# Patient Record
Sex: Male | Born: 1965 | Race: White | Hispanic: No | Marital: Single | Smoking: Current every day smoker
Health system: Southern US, Community
[De-identification: ages and names within clinical notes are randomized; demographics above are authoritative.]

---

## 2006-04-07 ENCOUNTER — Emergency Department (HOSPITAL_COMMUNITY): Admission: EM | Admit: 2006-04-07 | Discharge: 2006-04-08 | Payer: Self-pay | Admitting: Emergency Medicine

## 2007-05-08 ENCOUNTER — Ambulatory Visit: Payer: Self-pay | Admitting: Neurological Surgery

## 2011-11-01 ENCOUNTER — Emergency Department: Payer: Self-pay | Admitting: Internal Medicine

## 2011-11-01 LAB — BASIC METABOLIC PANEL
BUN: 13 mg/dL (ref 7–18)
Calcium, Total: 8.5 mg/dL (ref 8.5–10.1)
Co2: 25 mmol/L (ref 21–32)
EGFR (African American): >60
EGFR (Non-African Amer.): >60
Osmolality: 271 (ref 275–301)

## 2011-11-01 LAB — CBC
HCT: 44.1 % (ref 40.0–52.0)
HGB: 15 g/dL (ref 13.0–18.0)
MCHC: 33.9 g/dL (ref 32.0–36.0)
RDW: 13.3 % (ref 11.5–14.5)
WBC: 4.9 10*3/uL (ref 3.8–10.6)

## 2011-11-01 LAB — TROPONIN I: Troponin-I: <0.02 ng/mL

## 2015-02-04 ENCOUNTER — Encounter: Payer: Self-pay | Admitting: *Deleted

## 2015-02-04 ENCOUNTER — Emergency Department
Admission: EM | Admit: 2015-02-04 | Discharge: 2015-02-04 | Discharge status: Against Medical Advice | Payer: Self-pay | Attending: Emergency Medicine | Admitting: Emergency Medicine

## 2015-02-04 DIAGNOSIS — R1110 Vomiting, unspecified: Secondary | ICD-10-CM | POA: Insufficient documentation

## 2015-02-04 DIAGNOSIS — R197 Diarrhea, unspecified: Secondary | ICD-10-CM | POA: Insufficient documentation

## 2015-02-04 DIAGNOSIS — R52 Pain, unspecified: Secondary | ICD-10-CM | POA: Insufficient documentation

## 2015-02-04 DIAGNOSIS — Z720 Tobacco use: Secondary | ICD-10-CM | POA: Insufficient documentation

## 2015-02-04 DIAGNOSIS — R509 Fever, unspecified: Secondary | ICD-10-CM | POA: Insufficient documentation

## 2015-02-04 LAB — URINALYSIS COMPLETE WITH MICROSCOPIC (ARMC ONLY)
BACTERIA UA: NONE SEEN
BILIRUBIN URINE: NEGATIVE
Glucose, UA: NEGATIVE mg/dL
HGB URINE DIPSTICK: NEGATIVE
Ketones, ur: NEGATIVE mg/dL
Leukocytes, UA: NEGATIVE
Nitrite: NEGATIVE
PH: 7 (ref 5.0–8.0)
PROTEIN: NEGATIVE mg/dL
SPECIFIC GRAVITY, URINE: 1.02 (ref 1.005–1.030)

## 2015-02-04 LAB — COMPREHENSIVE METABOLIC PANEL
ALBUMIN: 4.3 g/dL (ref 3.5–5.0)
ALT: 14 U/L — ABNORMAL LOW (ref 17–63)
ANION GAP: 10 (ref 5–15)
AST: 25 U/L (ref 15–41)
Alkaline Phosphatase: 79 U/L (ref 38–126)
BILIRUBIN TOTAL: 0.6 mg/dL (ref 0.3–1.2)
BUN: 12 mg/dL (ref 6–20)
CALCIUM: 8.7 mg/dL — AB (ref 8.9–10.3)
CHLORIDE: 98 mmol/L — AB (ref 101–111)
CO2: 23 mmol/L (ref 22–32)
CREATININE: 0.94 mg/dL (ref 0.61–1.24)
GLUCOSE: 95 mg/dL (ref 65–99)
Potassium: 3.9 mmol/L (ref 3.5–5.1)
SODIUM: 131 mmol/L — AB (ref 135–145)
TOTAL PROTEIN: 7.3 g/dL (ref 6.5–8.1)

## 2015-02-04 LAB — CBC
HCT: 42.5 % (ref 40.0–52.0)
Hemoglobin: 14.7 g/dL (ref 13.0–18.0)
MCH: 29.6 pg (ref 26.0–34.0)
MCHC: 34.7 g/dL (ref 32.0–36.0)
MCV: 85.3 fL (ref 80.0–100.0)
Platelets: 191 10*3/uL (ref 150–440)
RBC: 4.98 MIL/uL (ref 4.40–5.90)
RDW: 13.2 % (ref 11.5–14.5)
WBC: 6.9 10*3/uL (ref 3.8–10.6)

## 2015-02-04 NOTE — ED Notes (Signed)
Pt states he has had muscle aches for several days. Today, low grade fevers, body aches, vomiting and diarrhea. Took Tylenol at 1400 today.

## 2015-08-28 ENCOUNTER — Encounter: Payer: Self-pay | Admitting: *Deleted

## 2015-08-28 ENCOUNTER — Emergency Department
Admission: EM | Admit: 2015-08-28 | Discharge: 2015-08-28 | Disposition: A | Discharge status: Home / Self Care | Payer: Self-pay | Attending: Emergency Medicine | Admitting: Emergency Medicine

## 2015-08-28 DIAGNOSIS — F17210 Nicotine dependence, cigarettes, uncomplicated: Secondary | ICD-10-CM | POA: Insufficient documentation

## 2015-08-28 DIAGNOSIS — K529 Noninfective gastroenteritis and colitis, unspecified: Secondary | ICD-10-CM

## 2015-08-28 LAB — GASTROINTESTINAL PANEL BY PCR, STOOL (REPLACES STOOL CULTURE)
ADENOVIRUS F40/41: NOT DETECTED
Astrovirus: NOT DETECTED
CAMPYLOBACTER SPECIES: NOT DETECTED
Cryptosporidium: NOT DETECTED
Cyclospora cayetanensis: NOT DETECTED
E. coli O157: NOT DETECTED
ENTEROAGGREGATIVE E COLI (EAEC): NOT DETECTED
ENTEROPATHOGENIC E COLI (EPEC): NOT DETECTED
Entamoeba histolytica: NOT DETECTED
Enterotoxigenic E coli (ETEC): NOT DETECTED
GIARDIA LAMBLIA: NOT DETECTED
NOROVIRUS GI/GII: NOT DETECTED
PLESIMONAS SHIGELLOIDES: NOT DETECTED
Rotavirus A: NOT DETECTED
Salmonella species: NOT DETECTED
Sapovirus (I, II, IV, and V): NOT DETECTED
Shiga like toxin producing E coli (STEC): NOT DETECTED
Shigella/Enteroinvasive E coli (EIEC): NOT DETECTED
Vibrio cholerae: NOT DETECTED
Vibrio species: NOT DETECTED
YERSINIA ENTEROCOLITICA: NOT DETECTED

## 2015-08-28 LAB — RAPID INFLUENZA A&B ANTIGENS
Influenza A (ARMC): NOT DETECTED
Influenza B (ARMC): NOT DETECTED

## 2015-08-28 LAB — COMPREHENSIVE METABOLIC PANEL
ALBUMIN: 4.4 g/dL (ref 3.5–5.0)
ALT: 26 U/L (ref 17–63)
ANION GAP: 13 (ref 5–15)
AST: 52 U/L — ABNORMAL HIGH (ref 15–41)
Alkaline Phosphatase: 92 U/L (ref 38–126)
BILIRUBIN TOTAL: 1 mg/dL (ref 0.3–1.2)
BUN: 19 mg/dL (ref 6–20)
CO2: 18 mmol/L — ABNORMAL LOW (ref 22–32)
Calcium: 8.6 mg/dL — ABNORMAL LOW (ref 8.9–10.3)
Chloride: 97 mmol/L — ABNORMAL LOW (ref 101–111)
Creatinine, Ser: 1.12 mg/dL (ref 0.61–1.24)
GFR calc Af Amer: >60 mL/min (ref >60)
GFR calc non Af Amer: >60 mL/min (ref >60)
GLUCOSE: 124 mg/dL — AB (ref 65–99)
POTASSIUM: 4 mmol/L (ref 3.5–5.1)
Sodium: 128 mmol/L — ABNORMAL LOW (ref 135–145)
TOTAL PROTEIN: 8 g/dL (ref 6.5–8.1)

## 2015-08-28 LAB — CBC
HCT: 47.4 % (ref 40.0–52.0)
Hemoglobin: 16.7 g/dL (ref 13.0–18.0)
MCH: 29.7 pg (ref 26.0–34.0)
MCHC: 35.2 g/dL (ref 32.0–36.0)
MCV: 84.3 fL (ref 80.0–100.0)
PLATELETS: 157 10*3/uL (ref 150–440)
RBC: 5.62 MIL/uL (ref 4.40–5.90)
RDW: 13.3 % (ref 11.5–14.5)
WBC: 8.7 10*3/uL (ref 3.8–10.6)

## 2015-08-28 LAB — LIPASE, BLOOD: LIPASE: 29 U/L (ref 11–51)

## 2015-08-28 LAB — C DIFFICILE QUICK SCREEN W PCR REFLEX
C DIFFICILE (CDIFF) INTERP: NEGATIVE
C DIFFICILE (CDIFF) TOXIN: NEGATIVE
C DIFFICLE (CDIFF) ANTIGEN: NEGATIVE

## 2015-08-28 MED ORDER — PROMETHAZINE HCL 25 MG/ML IJ SOLN
6.2500 mg | Freq: Once | INTRAMUSCULAR | Status: AC
Start: 2015-08-28 — End: 2015-08-28
  Administered 2015-08-28: 6.25 mg via INTRAVENOUS
  Filled 2015-08-28: qty 1

## 2015-08-28 MED ORDER — MORPHINE SULFATE (PF) 4 MG/ML IV SOLN
4.0000 mg | Freq: Once | INTRAVENOUS | Status: DC
  Filled 2015-08-28: qty 1

## 2015-08-28 MED ORDER — DICYCLOMINE HCL 10 MG/ML IM SOLN
20.0000 mg | Freq: Once | INTRAMUSCULAR | Status: AC
  Administered 2015-08-28: 20 mg via INTRAMUSCULAR
  Filled 2015-08-28: qty 2

## 2015-08-28 MED ORDER — ONDANSETRON HCL 4 MG PO TABS: 4.0000 mg | ORAL_TABLET | Freq: Three times a day (TID) | ORAL | Status: DC | PRN

## 2015-08-28 MED ORDER — HYDROMORPHONE HCL 1 MG/ML IJ SOLN: 0.5000 mg | Freq: Once | INTRAMUSCULAR | Status: DC

## 2015-08-28 MED ORDER — DICYCLOMINE HCL 20 MG PO TABS: 20.0000 mg | ORAL_TABLET | Freq: Three times a day (TID) | ORAL | Status: AC | PRN

## 2015-08-28 MED ORDER — ONDANSETRON 4 MG PO TBDP
4.0000 mg | ORAL_TABLET | Freq: Once | ORAL | Status: AC | PRN
  Administered 2015-08-28: 4 mg via ORAL
  Filled 2015-08-28: qty 1

## 2015-08-28 MED ORDER — ONDANSETRON HCL 4 MG/2ML IJ SOLN
4.0000 mg | Freq: Once | INTRAMUSCULAR | Status: AC
  Administered 2015-08-28: 4 mg via INTRAVENOUS
  Filled 2015-08-28: qty 2

## 2015-08-28 MED ORDER — SODIUM CHLORIDE 0.9 % IV BOLUS (SEPSIS)
1000.0000 mL | INTRAVENOUS | Status: AC
  Administered 2015-08-28: 1000 mL via INTRAVENOUS

## 2015-08-28 NOTE — ED Provider Notes (Signed)
Time Seen: Approximately 0722  I have reviewed the triage notes  Chief Complaint: Emesis   History of Present Illness: Scott Hardy is a 50 y.o. male who states he started developing a fever at home this past Thursday of 102.6 at its height. He denies any shortness of breath or chest pain though he states he started having some nausea which started yesterday and then started having some persistent vomiting with no hematemesis or biliary emesis. States she's had difficulty maintaining any food or fluid intake. Crampy intermittent diffuse abdominal pain with extensive loose watery stool. He denies any melena or hematochezia. Denies any focal abdominal pain. He denies any foodborne exposure. Patient does work out of town at [employer]. He is not aware of any significant change in his diet.   History reviewed. No pertinent past medical history.  There are no active problems to display for this patient.   History reviewed. No pertinent past surgical history.  History reviewed. No pertinent past surgical history.  No current outpatient prescriptions on file.  Allergies:  Review of patient's allergies indicates no known allergies.  Family History: History reviewed. No pertinent family history.  Social History: Social History  Substance Use Topics  . Smoking status: Current Every Day Smoker    Types: Cigarettes  . Smokeless tobacco: None  . Alcohol Use: No     Review of Systems:   10 point review of systems was performed and was otherwise negative:  Constitutional: No fever Eyes: No visual disturbances ENT: No sore throat, ear pain Cardiac: No chest pain Respiratory: No shortness of breath, wheezing, or stridor Abdomen: Mild crampy diffuse abdominal pain Endocrine: No weight loss, No night sweats Extremities: No peripheral edema, cyanosis Skin: No rashes, easy bruising Neurologic: No focal weakness, trouble with speech or swollowing Urologic: No dysuria, Hematuria,  or urinary frequency   Physical Exam:  ED Triage Vitals  Enc Vitals Group     BP 08/28/15 0007 149/85 mmHg     Pulse Rate 08/28/15 0007 110     Resp 08/28/15 0007 16     Temp 08/28/15 0007 99.4 F (37.4 C)     Temp Source 08/28/15 0007 Oral     SpO2 08/28/15 0007 94 %     Weight 08/28/15 0007 172 lb (78.019 kg)     Height 08/28/15 0007  (1.702 m)     Head Cir --      Peak Flow --      Pain Score 08/28/15 0007 9     Pain Loc --      Pain Edu? --      Excl. in GC? --     General: Awake , Alert , and Oriented times 3; GCS 15 Head: Normal cephalic , atraumatic Eyes: Pupils equal , round, reactive to light Nose/Throat: No nasal drainage, patent upper airway without erythema or exudate.  Neck: Supple, Full range of motion, No anterior adenopathy or palpable thyroid masses Lungs: Clear to ascultation without wheezes , rhonchi, or rales Heart: Regular rate, regular rhythm without murmurs , gallops , or rubs Abdomen: Soft, non tender without rebound, guarding , or rigidity; bowel sounds positive and symmetric in all 4 quadrants. No organomegaly .        Extremities: 2 plus symmetric pulses. No edema, clubbing or cyanosis Neurologic: normal ambulation, Motor symmetric without deficits, sensory intact Skin: warm, dry, no rashes   Labs:   All laboratory work was reviewed including any pertinent negatives or positives listed  below:  Labs Reviewed  COMPREHENSIVE METABOLIC PANEL - Abnormal; Notable for the following:    Sodium 128 (*)    Chloride 97 (*)    CO2 18 (*)    Glucose, Bld 124 (*)    Calcium 8.6 (*)    AST 52 (*)    All other components within normal limits  RAPID INFLUENZA A&B ANTIGENS ([facility] ONLY)  C DIFFICILE QUICK SCREEN W PCR REFLEX  GASTROINTESTINAL PANEL BY PCR, STOOL (REPLACES STOOL CULTURE)  LIPASE, BLOOD  CBC  URINALYSIS COMPLETEWITH MICROSCOPIC ([facility] ONLY)   review of the patient's laboratory work shows some hyponatremia. Currently stool sample and  urinalysis are pending. The patient's C. difficile was negative EKG: * ED ECG REPORT I, Jennye Moccasin, the attending physician, personally viewed and interpreted this ECG.  Date: 08/28/2015 EKG Time: 00-25 Rate: 105 Rhythm: Sinus tachycardia QRS Axis: Rightward axis Intervals: normal ST/T Wave abnormalities: normal Conduction Disturbances: none Narrative Interpretation: unremarkable No acute ischemic changes     ED Course:  Patient was given IV fluid resuscitation and I felt could be treated on an outpatient basis. It took a significant period of time to get an adequate stool sample from the patient, but currently it looks like this is not C. difficile diarrhea. I advised the patient he can now start over-the-counter antidiarrhea medication such as Imodium. Patient was also prescribed Zofran and Bentyl on an outpatient basis and advised to advance his diet as tolerated. I felt he did not have a bowel obstruction, acute appendicitis, or other surgical causes for his symptoms. Given the frequency in the community and felt most likely this was viral gastroenteritis.    Assessment:  Acute gastroenteritis     Plan:  Outpatient management Patient was advised to return immediately if condition worsens. Patient was advised to follow up with their primary care physician or other specialized physicians involved in their outpatient care             Jennye Moccasin, MD 08/28/15 1523

## 2015-08-28 NOTE — ED Notes (Signed)
Pt states that "one Vicodin made me pass out before"; pt would like to try other med than narcotic for pain relief, Dr York Cerise notified, morphine returned

## 2015-08-28 NOTE — ED Notes (Signed)
EKG completed at 00:20

## 2015-08-28 NOTE — ED Notes (Addendum)
Pt arrived to ED via EMS from home reporting flu like symptoms beginning three days ago. Pt reports large amounts of diarrhea and vomiting approx. 7 times today. Pt reports having a feverfor the past three days that he has been treating with ibuprofen. No fever upon arrival. Pt reports having body aches and reports weakness. Pt also reports having two different syncopal episodes this evening after standing to go to the bathroom.

## 2016-09-19 ENCOUNTER — Emergency Department: Payer: Self-pay

## 2016-09-19 ENCOUNTER — Emergency Department
Admission: EM | Admit: 2016-09-19 | Discharge: 2016-09-19 | Disposition: A | Discharge status: Home / Self Care | Payer: Self-pay | Attending: Emergency Medicine | Admitting: Emergency Medicine

## 2016-09-19 ENCOUNTER — Encounter: Payer: Self-pay | Admitting: Emergency Medicine

## 2016-09-19 DIAGNOSIS — N23 Unspecified renal colic: Secondary | ICD-10-CM | POA: Insufficient documentation

## 2016-09-19 DIAGNOSIS — R109 Unspecified abdominal pain: Secondary | ICD-10-CM

## 2016-09-19 DIAGNOSIS — F17210 Nicotine dependence, cigarettes, uncomplicated: Secondary | ICD-10-CM | POA: Insufficient documentation

## 2016-09-19 LAB — URINALYSIS, COMPLETE (UACMP) WITH MICROSCOPIC
Bacteria, UA: NONE SEEN
Bilirubin Urine: NEGATIVE
GLUCOSE, UA: NEGATIVE mg/dL
Hgb urine dipstick: NEGATIVE
Ketones, ur: NEGATIVE mg/dL
Leukocytes, UA: NEGATIVE
Nitrite: NEGATIVE
PH: 5 (ref 5.0–8.0)
Protein, ur: NEGATIVE mg/dL
SPECIFIC GRAVITY, URINE: 1.024 (ref 1.005–1.030)
Squamous Epithelial / LPF: NONE SEEN

## 2016-09-19 MED ORDER — HYDROMORPHONE HCL 2 MG PO TABS: 2.0000 mg | ORAL_TABLET | Freq: Two times a day (BID) | ORAL | 0 refills | Status: AC | PRN

## 2016-09-19 MED ORDER — SODIUM CHLORIDE 0.9 % IV BOLUS (SEPSIS)
500.0000 mL | Freq: Once | INTRAVENOUS | Status: AC
  Administered 2016-09-19: 500 mL via INTRAVENOUS

## 2016-09-19 MED ORDER — TAMSULOSIN HCL 0.4 MG PO CAPS: 0.4000 mg | ORAL_CAPSULE | Freq: Every day | ORAL | 0 refills | Status: AC

## 2016-09-19 MED ORDER — ONDANSETRON HCL 4 MG/2ML IJ SOLN
4.0000 mg | Freq: Once | INTRAMUSCULAR | Status: AC
  Administered 2016-09-19: 4 mg via INTRAVENOUS
  Filled 2016-09-19: qty 2

## 2016-09-19 MED ORDER — KETOROLAC TROMETHAMINE 30 MG/ML IJ SOLN
30.0000 mg | Freq: Once | INTRAMUSCULAR | Status: AC
  Administered 2016-09-19: 30 mg via INTRAVENOUS
  Filled 2016-09-19: qty 1

## 2016-09-19 MED ORDER — ONDANSETRON HCL 4 MG PO TABS: 4.0000 mg | ORAL_TABLET | Freq: Three times a day (TID) | ORAL | 0 refills | Status: AC | PRN

## 2016-09-19 MED ORDER — IBUPROFEN 800 MG PO TABS: 800.0000 mg | ORAL_TABLET | Freq: Three times a day (TID) | ORAL | 0 refills | Status: DC | PRN

## 2016-09-19 MED ORDER — HYDROMORPHONE HCL 1 MG/ML IJ SOLN
1.0000 mg | Freq: Once | INTRAMUSCULAR | Status: AC
  Administered 2016-09-19: 1 mg via INTRAVENOUS
  Filled 2016-09-19: qty 1

## 2016-09-19 NOTE — ED Triage Notes (Signed)
Pt with abd pain since this am with nausea.

## 2016-09-19 NOTE — ED Provider Notes (Signed)
Time Seen: Approximately 0914  I have reviewed the triage notes  Chief Complaint: Abdominal Pain   History of Present Illness: Scott Hardy is a 51 y.o. male who presents with acute onset of left lower quadrant abdominal pain with nausea and no vomiting. He states his pain started at 3 AM and he has significant urgency to urinate but is been unable to produce any urine. Denies any testicular pain or masses. He denies any right-sided abdominal or flank pain.   History reviewed. No pertinent past medical history.  There are no active problems to display for this patient.   History reviewed. No pertinent surgical history.  History reviewed. No pertinent surgical history.  Current Outpatient Rx  . Order #: [id number] Class: Print  . Order #: [id number] Class: Print    Allergies:  Patient has no known allergies.  Family History: No family history on file.  Social History: Social History  Substance Use Topics  . Smoking status: Current Every Day Smoker    Types: Cigarettes  . Smokeless tobacco: Never Used  . Alcohol use No     Review of Systems:   10 point review of systems was performed and was otherwise negative:  Constitutional: No fever Eyes: No visual disturbances ENT: No sore throat, ear pain Cardiac: No chest pain Respiratory: No shortness of breath, wheezing, or stridor Abdomen:Patient points mainly to the left groin area. Endocrine: No weight loss, No night sweats Extremities: No peripheral edema, cyanosis Skin: No rashes, easy bruising Neurologic: No focal weakness, trouble with speech or swollowing Urologic: No dysuria, Hematuria, or urinary frequency   Physical Exam:  ED Triage Vitals [09/19/16 0905]  Enc Vitals Group     BP (!) 151/84     Pulse Rate 80     Resp 20     Temp      Temp Source Oral     SpO2 100 %     Weight 172 lb (78 kg)     Height      Head Circumference      Peak Flow      Pain Score 10     Pain Loc      Pain Edu?     Excl. in GC?     General: Awake , Alert , and Oriented times 3; GCS 15 Anxious, appears uncomfortable Head: Normal cephalic , atraumatic Eyes: Pupils equal , round, reactive to light Nose/Throat: No nasal drainage, patent upper airway without erythema or exudate.  Neck: Supple, Full range of motion, No anterior adenopathy or palpable thyroid masses Lungs: Clear to ascultation without wheezes , rhonchi, or rales Heart: Regular rate, regular rhythm without murmurs , gallops , or rubs Abdomen: Tender toward the left lower quadrant with some guarding, no rebound or rigidity is noted. No testicular pain or masses are noted       Extremities: 2 plus symmetric pulses. No edema, clubbing or cyanosis Neurologic: normal ambulation, Motor symmetric without deficits, sensory intact Skin: warm, dry, no rashes   Labs:   All laboratory work was reviewed including any pertinent negatives or positives listed below:  Labs Reviewed  URINALYSIS, COMPLETE (UACMP) WITH MICROSCOPIC  Laboratory work was reviewed and showed no clinically significant abnormalities.  Radiology: "Ct Renal Stone Study  Result Date: 09/19/2016 CLINICAL DATA:  Left lower quadrant abdominal and flank pain, some dysuria, history of kidney stones EXAM: CT ABDOMEN AND PELVIS WITHOUT CONTRAST TECHNIQUE: Multidetector CT imaging of the abdomen and pelvis was performed following the standard protocol  without IV contrast. COMPARISON:  None. FINDINGS: Lower chest: The lung bases are clear. The heart is within normal limits in size. Hepatobiliary: The liver is unremarkable in the unenhanced state. No calcified gallstones are seen. Pancreas: The pancreas is normal in size and the pancreatic duct is not dilated. Spleen: The spleen is unremarkable and a small splenule is present. Adrenals/Urinary Tract: The adrenal glands appear normal. Only a questionable tiny nonobstructing left renal calculus is seen. No right renal calculi are noted. However  there is mild left hydronephrosis and left hydroureter to the bladder. At the left UV junction there is a 4 mm left ureteral calculus possibly within the left intramuscular portion of the UV junction. The right ureter is normal in caliber. The urinary bladder is not well distended but no abnormality is seen. Stomach/Bowel: The stomach is not well distended. No abnormality of small bowel is seen. No abnormality of the colon is seen. The terminal ileum and the appendix are unremarkable. Vascular/Lymphatic: The abdominal aorta is normal caliber with moderate abdominal aortic atherosclerosis for age. No adenopathy is seen. Reproductive: The prostate is normal in size with prostatic calcifications present. Other: None Musculoskeletal: The lumbar vertebrae are in normal alignment. Only the L4-5 disc space is slightly narrowed. The SI joints are well corticated. IMPRESSION: 1. Mild left hydronephrosis and hydroureter caused by a left UV junction calculus of 4 mm in diameter possibly within the intramuscular portion of the left UV junction. 2. Questionable tiny nonobstructing left renal calculus. No definite right renal calculi. 3. Moderate abdominal aortic atherosclerosis. Electronically Signed   By: Dwyane DeePaul  Barry M.D.   On: 09/19/2016 09:59  "  I personally reviewed the radiologic studies    ED Course: Patient's stay was uneventful and he was given Toradol and 2 rounds of IV Dilaudid with IV Zofran with symptomatic relief of his pain. His differential is somewhat limited including renal infarct, renal colic, testicular torsion, urinary retention, urinary tract infection, etc. Given his current clinical presentation and objective findings appears that he has renal colic.     Assessment:  Renal colic  Final Clinical Impression: *  Final diagnoses:  Left sided abdominal pain     Plan:  Outpatient " New Prescriptions   HYDROMORPHONE (DILAUDID) 2 MG TABLET    Take 1 tablet (2 mg total) by mouth every 12  (twelve) hours as needed for severe pain.   IBUPROFEN (ADVIL,MOTRIN) 800 MG TABLET    Take 1 tablet (800 mg total) by mouth every 8 (eight) hours as needed.   ONDANSETRON (ZOFRAN) 4 MG TABLET    Take 1 tablet (4 mg total) by mouth every 8 (eight) hours as needed for nausea or vomiting.   TAMSULOSIN (FLOMAX) 0.4 MG CAPS CAPSULE    Take 1 capsule (0.4 mg total) by mouth daily.  "  Patient was advised to return immediately if condition worsens. Patient was advised to follow up with their primary care physician or other specialized physicians involved in their outpatient care. The patient and/or family member/power of attorney had laboratory results reviewed at the bedside. All questions and concerns were addressed and appropriate discharge instructions were distributed by the nursing staff.             Jennye MoccasinBrian S Kery Batzel, MD 09/19/16 [phone]

## 2016-09-19 NOTE — Discharge Instructions (Signed)
Please return immediately if condition worsens. Please contact her primary physician or the physician you were given for referral. If you have any specialist physicians involved in her treatment and plan please also contact them. Thank you for using [facility]. ° °

## 2016-09-19 NOTE — ED Notes (Signed)
Patient transported to CT 

## 2018-06-14 IMAGING — CT CT RENAL STONE PROTOCOL
2 of 4 series · 16 of 46 positions shown, 18 images · non-contrast
Comparison: None.

CLINICAL DATA: Left lower quadrant abdominal and flank pain, some
dysuria, history of kidney stones

EXAM:
CT ABDOMEN AND PELVIS WITHOUT CONTRAST
TECHNIQUE: Multidetector CT imaging of the abdomen and pelvis was performed
following the standard protocol without IV contrast.

[Series 2: stone full standard · axial · 0.76mm/px · z∈[-500,-120]mm · 13 of 84 slices shown, 15 images]
[im 4/84  soft-tissue]
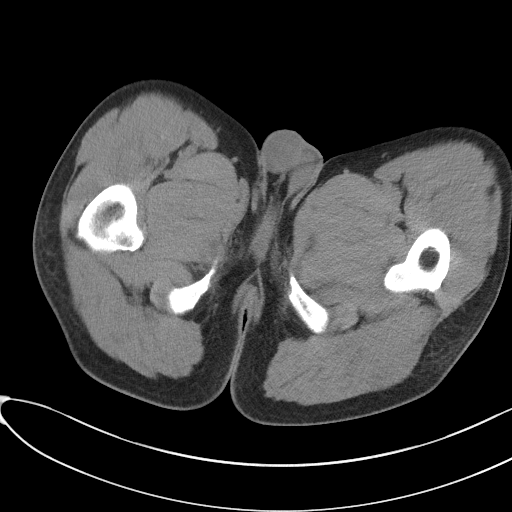
[im 4/84  bone]
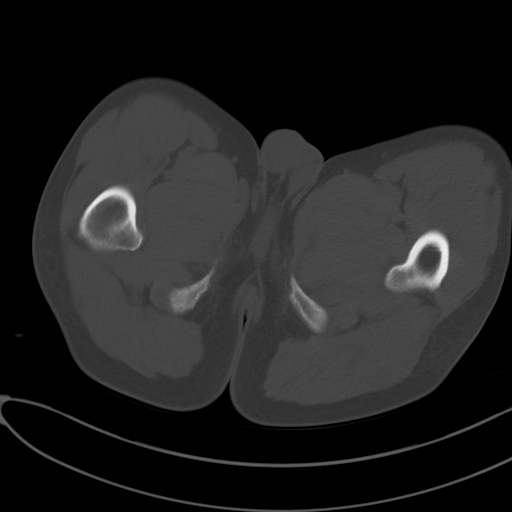
[im 10/84  soft-tissue]
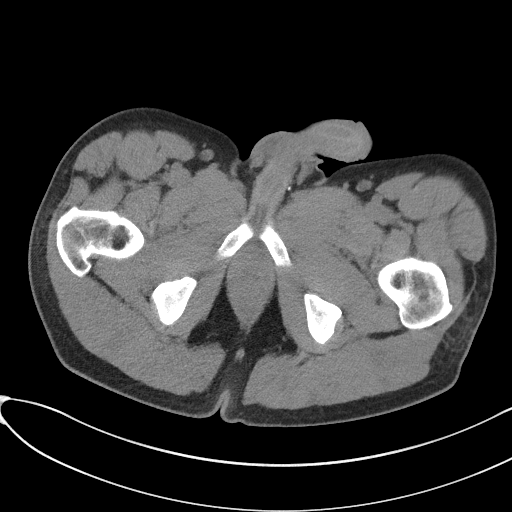
[im 17/84  soft-tissue]
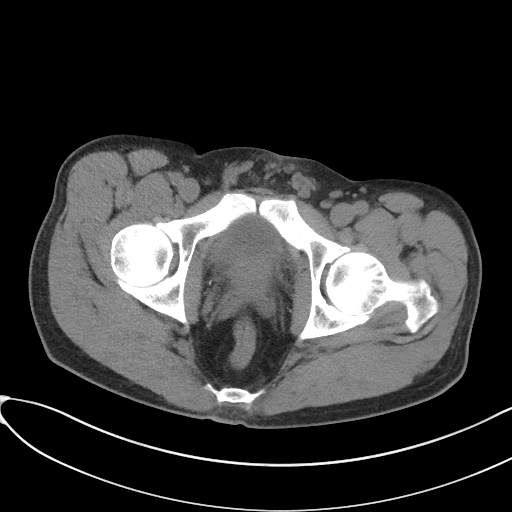
[im 24/84  soft-tissue]
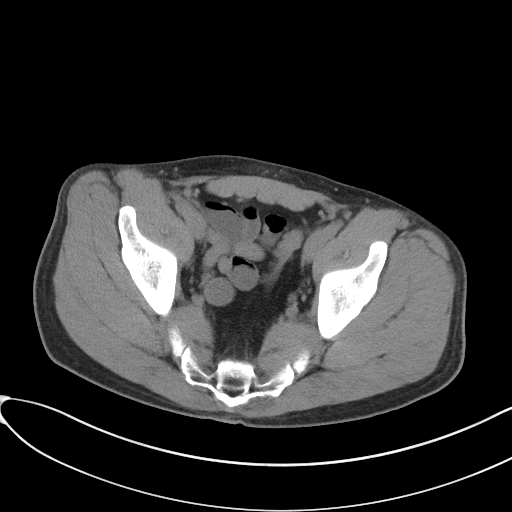
[im 30/84  soft-tissue]
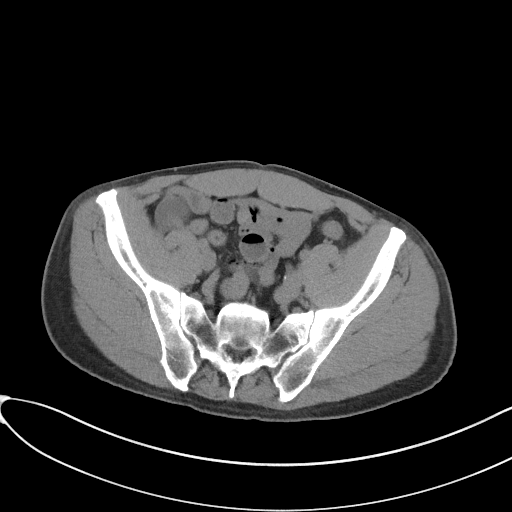
[im 37/84  soft-tissue]
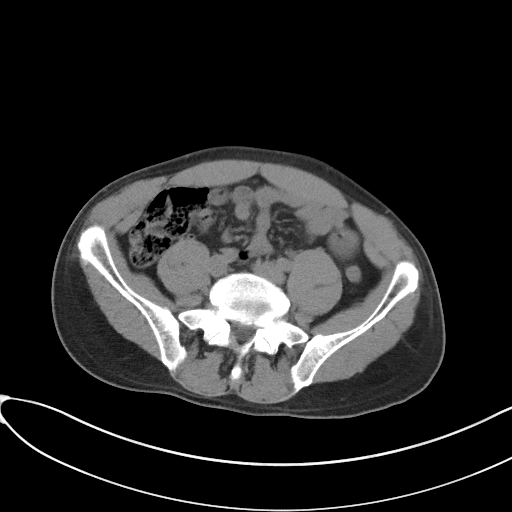
[im 44/84  soft-tissue]
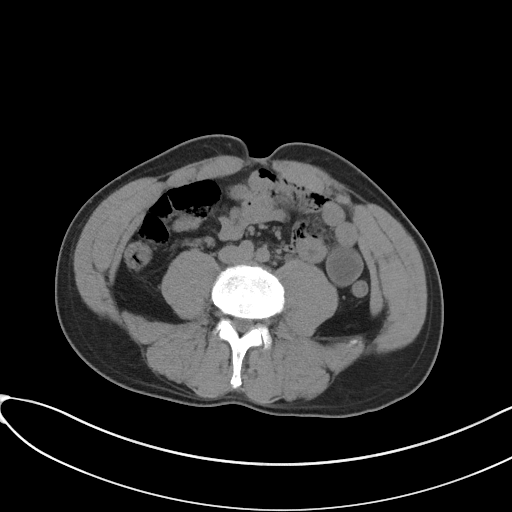
[im 47/84  soft-tissue]
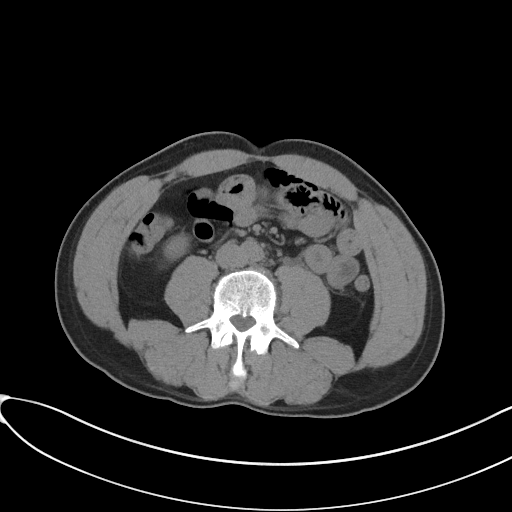
[im 54/84  soft-tissue]
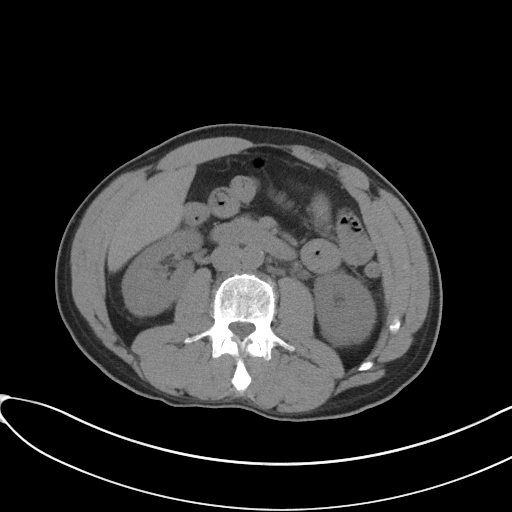
[im 54/84  bone]
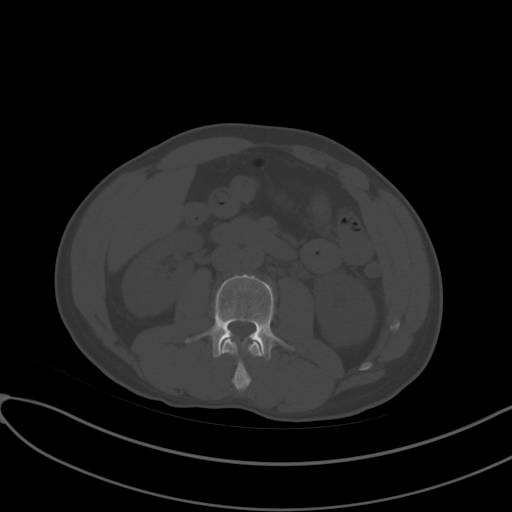
[im 60/84  soft-tissue]
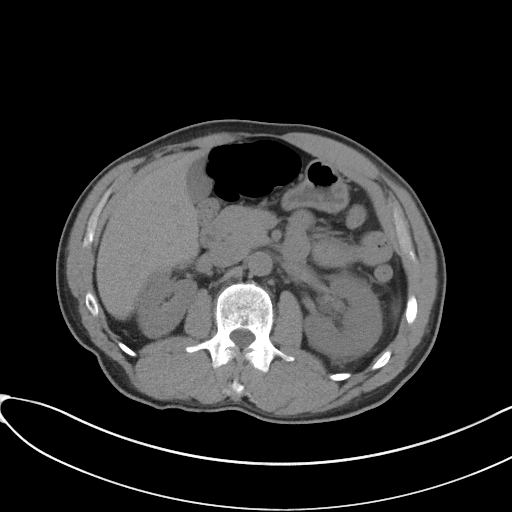
[im 67/84  soft-tissue]
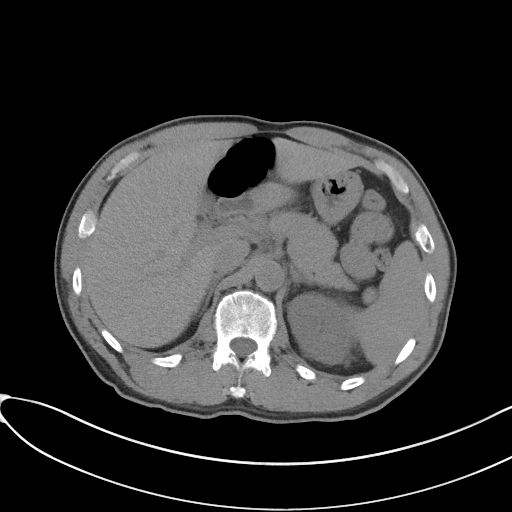
[im 74/84  soft-tissue]
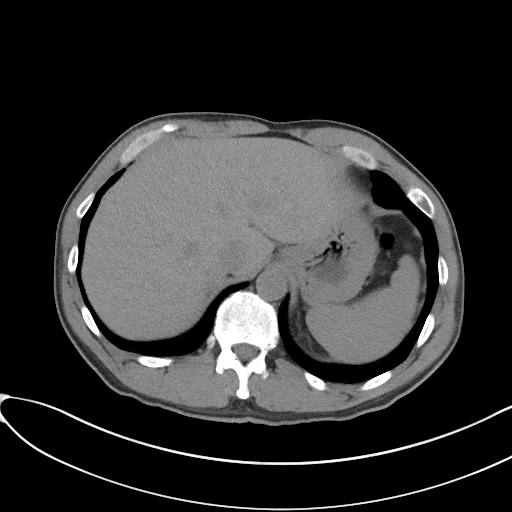
[im 80/84  soft-tissue]
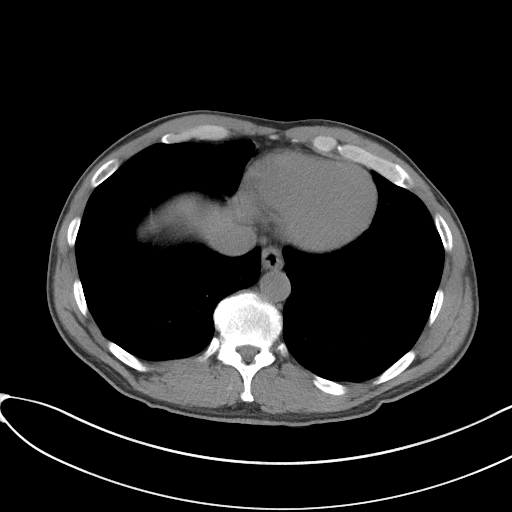

[Series 5: coronal · coronal · 0.77mm/px · 3 of 122 slices shown]
[im 41/122  soft-tissue]
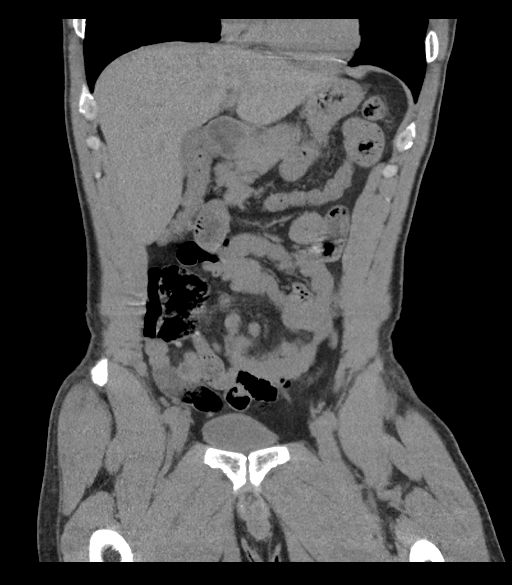
[im 54/122  soft-tissue]
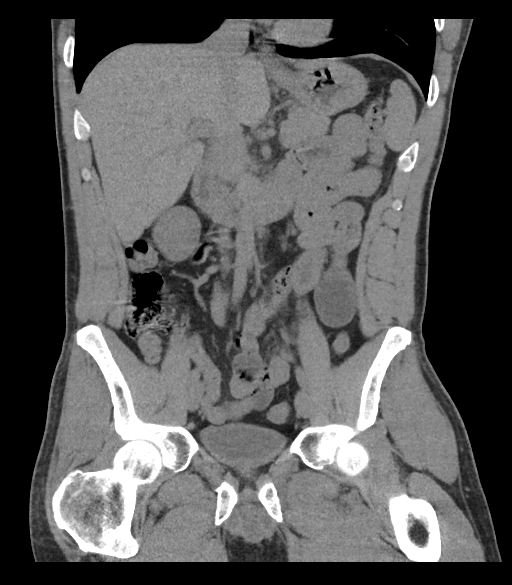
[im 68/122  soft-tissue]
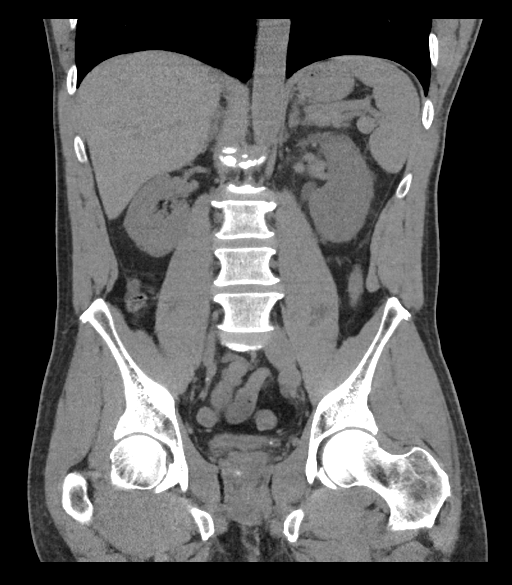

[16 of 46 positions shown; findings below may reference images not displayed]

FINDINGS: Lower chest: The lung bases are clear. The heart is within normal
limits in size.

Hepatobiliary: The liver is unremarkable in the unenhanced state. No
calcified gallstones are seen.

Pancreas: The pancreas is normal in size and the pancreatic duct is
not dilated.

Spleen: The spleen is unremarkable and a small splenule is present.

Adrenals/Urinary Tract: The adrenal glands appear normal. Only a
questionable tiny nonobstructing left renal calculus is seen. No
right renal calculi are noted. However there is mild left
hydronephrosis and left hydroureter to the bladder. At the left UV
junction there is a 4 mm left ureteral calculus possibly within the
left intramuscular portion of the UV junction. The right ureter is
normal in caliber. The urinary bladder is not well distended but no
abnormality is seen.

Stomach/Bowel: The stomach is not well distended. No abnormality of
small bowel is seen. No abnormality of the colon is seen. The
terminal ileum and the appendix are unremarkable.

Vascular/Lymphatic: The abdominal aorta is normal caliber with
moderate abdominal aortic atherosclerosis for age. No adenopathy is
seen.

Reproductive: The prostate is normal in size with prostatic
calcifications present.

Other: None

Musculoskeletal: The lumbar vertebrae are in normal alignment. Only
the L4-5 disc space is slightly narrowed. The SI joints are well
corticated.
IMPRESSION: 1. Mild left hydronephrosis and hydroureter caused by a left UV
junction calculus of 4 mm in diameter possibly within the
intramuscular portion of the left UV junction.
2. Questionable tiny nonobstructing left renal calculus. No definite
right renal calculi.
3. Moderate abdominal aortic atherosclerosis.

## 2019-01-21 ENCOUNTER — Ambulatory Visit: Payer: Self-pay | Admitting: Adult Health

## 2020-07-14 ENCOUNTER — Other Ambulatory Visit
Admission: RE | Admit: 2020-07-14 | Discharge: 2020-07-14 | Disposition: A | Discharge status: Home / Self Care | Payer: Self-pay | Source: Ambulatory Visit | Attending: Pediatrics | Admitting: Pediatrics

## 2020-07-14 DIAGNOSIS — Z03818 Encounter for observation for suspected exposure to other biological agents ruled out: Secondary | ICD-10-CM | POA: Insufficient documentation

## 2020-07-14 DIAGNOSIS — M546 Pain in thoracic spine: Secondary | ICD-10-CM | POA: Insufficient documentation

## 2020-07-14 LAB — D-DIMER, QUANTITATIVE (NOT AT ARMC): D-Dimer, Quant: 0.45 ug/mL-FEU (ref 0.00–0.50)

## 2023-11-23 ENCOUNTER — Emergency Department
Admission: EM | Admit: 2023-11-23 | Discharge: 2023-11-23 | Disposition: A | Discharge status: Home / Self Care | Payer: Self-pay | Attending: Emergency Medicine | Admitting: Emergency Medicine

## 2023-11-23 ENCOUNTER — Other Ambulatory Visit: Payer: Self-pay

## 2023-11-23 DIAGNOSIS — B349 Viral infection, unspecified: Secondary | ICD-10-CM | POA: Insufficient documentation

## 2023-11-23 LAB — RESP PANEL BY RT-PCR (RSV, FLU A&B, COVID)  RVPGX2
Influenza A by PCR: NEGATIVE
Influenza B by PCR: NEGATIVE
Resp Syncytial Virus by PCR: NEGATIVE
SARS Coronavirus 2 by RT PCR: NEGATIVE

## 2023-11-23 MED ORDER — ACETAMINOPHEN 325 MG PO TABS
650.0000 mg | ORAL_TABLET | Freq: Once | ORAL | Status: AC
  Administered 2023-11-23: 650 mg via ORAL
  Filled 2023-11-23: qty 2

## 2023-11-23 MED ORDER — KETOROLAC TROMETHAMINE 30 MG/ML IJ SOLN
15.0000 mg | Freq: Once | INTRAMUSCULAR | Status: AC
  Administered 2023-11-23: 15 mg via INTRAVENOUS
  Filled 2023-11-23: qty 1

## 2023-11-23 MED ORDER — NAPROXEN 500 MG PO TABS: 500.0000 mg | ORAL_TABLET | Freq: Two times a day (BID) | ORAL | 0 refills | Status: AC

## 2023-11-23 MED ORDER — SODIUM CHLORIDE 0.9 % IV BOLUS
1000.0000 mL | Freq: Once | INTRAVENOUS | Status: AC
  Administered 2023-11-23: 1000 mL via INTRAVENOUS

## 2023-11-23 NOTE — ED Triage Notes (Signed)
 Pt states he thinks he has been running a fever, has been having the chills. Denies any n/v/d. Has been having a headache. Hasn't ate since yesterday at 5.

## 2023-11-23 NOTE — ED Notes (Signed)
 ..  The patient is A&OX4, ambulatory at d/c with independent steady gait, NAD. Pt verbalized understanding of d/c instructions, prescription and follow up care.

## 2023-11-23 NOTE — Discharge Instructions (Signed)
 You may take Tylenol in addition to the Naprosyn prescribed.   please increase your fluid intake.  Follow-up with primary care, urgent care, or return to the emergency department for symptoms that change or worsen.

## 2023-11-23 NOTE — ED Provider Notes (Signed)
 [facility] Provider Note    Event Date/Time   First MD Initiated Contact with Patient 11/23/23 1238     (approximate)   History   Fever   HPI  Scott Hardy is a 58 y.o. male with no significant past medical history and as listed in EMR presents to the emergency department for treatment and evaluation of fever, headache, body aches.  Symptoms started 2 days ago.  No relief with over-the-counter medications.  He has not taken anything today for symptoms.      Physical Exam   Triage Vital Signs: ED Triage Vitals  Encounter Vitals Group     BP 11/23/23 1229 (!) 153/81     Systolic BP Percentile --      Diastolic BP Percentile --      Pulse Rate 11/23/23 1229 (!) 103     Resp 11/23/23 1229 18     Temp 11/23/23 1229 (!) 101.9 F (38.8 C)     Temp Source 11/23/23 1229 Oral     SpO2 11/23/23 1229 94 %     Weight --      Height --      Head Circumference --      Peak Flow --      Pain Score 11/23/23 1230 10     Pain Loc --      Pain Education --      Exclude from Growth Chart --     Most recent vital signs: Vitals:   11/23/23 1229 11/23/23 1418  BP: (!) 153/81   Pulse: (!) 103 89  Resp: 18   Temp: (!) 101.9 F (38.8 C) 98.1 F (36.7 C)  SpO2: 94% 98%    General: Awake, no distress.  CV:  Good peripheral perfusion.  Resp:  Normal effort.  Breath sounds are clear Abd:  No distention.  Other:     ED Results / Procedures / Treatments   Labs (all labs ordered are listed, but only abnormal results are displayed) Labs Reviewed  RESP PANEL BY RT-PCR (RSV, FLU A&B, COVID)  [id number]     EKG  Not indicated   RADIOLOGY  Image and radiology report reviewed and interpreted by me. Radiology report consistent with the same.  Not indicated  PROCEDURES:  Critical Care performed: No  Procedures   MEDICATIONS ORDERED IN ED:  Medications  acetaminophen (TYLENOL) tablet 650 mg (650 mg Oral Given 11/23/23 1235)  sodium chloride   0.9 % bolus 1,000 mL (1,000 mLs Intravenous New Bag/Given 11/23/23 1312)  ketorolac  (TORADOL ) 30 MG/ML injection 15 mg (15 mg Intravenous Given 11/23/23 1312)     IMPRESSION / MDM / ASSESSMENT AND PLAN / ED COURSE   I have reviewed the triage note.  Differential diagnosis includes, but is not limited to, influenza, COVID, viral syndrome  Patient's presentation is most consistent with acute illness / injury with system symptoms.  58 year old male presenting to the emergency department for treatment and evaluation of headache, fever, body aches for the past 2 days.  Respiratory panel is pending.  He is febrile at 101.9 and was given Tylenol.  He is also slightly tachycardic likely related to his fever but reports he feels "dehydrated."  He has not had any vomiting or diarrhea but has had decrease in fluid intake since onset of symptoms.  Plan will be to give him a liter of fluids and Toradol  for his body aches.  Patient agreeable to the plan.  Respiratory panel is pending.  Clinical Course as of 11/23/23 1421  Sat Nov 23, 2023  1420 Upon reassessment, patient reports that he is feeling much better.  Negative results of the respiratory panel reviewed with patient.  Temperature and tachycardia have resolved after fluids.  He will be discharged home with prescription for Naprosyn and encouraged to increase his fluid intake.  Outpatient follow-up and ER return precautions were discussed. [CT]    Clinical Course User Index [CT] Hershal Eriksson B, FNP     FINAL CLINICAL IMPRESSION(S) / ED DIAGNOSES   Final diagnoses:  Acute viral syndrome     Rx / DC Orders   ED Discharge Orders          Ordered    naproxen (NAPROSYN) 500 MG tablet  2 times daily with meals        11/23/23 1419             Note:  This document was prepared using Dragon voice recognition software and may include unintentional dictation errors.   Sherryle Don, FNP 11/23/23 1421    Iver Marker,  MD 11/23/23 1526
